# Patient Record
Sex: Male | Born: 1980 | Race: White | Hispanic: No | Marital: Married | Smoking: Never smoker
Health system: Southern US, Community
[De-identification: ages and names within clinical notes are randomized; demographics above are authoritative.]

---

## 2017-07-31 ENCOUNTER — Ambulatory Visit: Payer: Self-pay | Admitting: Family

## 2019-09-08 ENCOUNTER — Ambulatory Visit: Payer: BC Managed Care – PPO | Admitting: Family Medicine

## 2019-09-08 ENCOUNTER — Encounter: Payer: Self-pay | Admitting: Family Medicine

## 2019-09-08 ENCOUNTER — Other Ambulatory Visit: Payer: Self-pay

## 2019-09-08 VITALS — BP 124/79 | HR 57 | Temp 98.2°F | Wt 140.0 lb

## 2019-09-08 DIAGNOSIS — Z7689 Persons encountering health services in other specified circumstances: Secondary | ICD-10-CM

## 2019-09-08 DIAGNOSIS — Z13228 Encounter for screening for other metabolic disorders: Secondary | ICD-10-CM

## 2019-09-08 DIAGNOSIS — M7989 Other specified soft tissue disorders: Secondary | ICD-10-CM

## 2019-09-08 DIAGNOSIS — Z130 Encounter for screening for diseases of the blood and blood-forming organs and certain disorders involving the immune mechanism: Secondary | ICD-10-CM

## 2019-09-08 DIAGNOSIS — Z13220 Encounter for screening for lipoid disorders: Secondary | ICD-10-CM

## 2019-09-08 NOTE — Patient Instructions (Addendum)
I think this is a cyst (benign).  Differential diagnosis is lipoma (benign fatty mass).  We are getting an ultrasound to further evaluate it.  If it is inconclusive or abnormal, we will order MRI.  I will contact you with results once they are available.   Also, future labs are in.  You can come in fasting any time for this or wait until your DOT physical (can be done with [facility] or dr Darlyn Read here)  Epidermal Cyst  An epidermal cyst is a sac made of skin tissue. The sac contains a substance called keratin. Keratin is a protein that is normally secreted through the hair follicles. When keratin becomes trapped in the top layer of skin (epidermis), it can form an epidermal cyst. Epidermal cysts can be found anywhere on your body. These cysts are usually harmless (benign), and they may not cause symptoms unless they become infected. What are the causes? This condition may be caused by:  A blocked hair follicle.  A hair that curls and re-enters the skin instead of growing straight out of the skin (ingrown hair).  A blocked pore.  Irritated skin.  An injury to the skin.  Certain conditions that are passed along from parent to child (inherited).  Human papillomavirus (HPV).  Long-term (chronic) sun damage to the skin. What increases the risk? The following factors may make you more likely to develop an epidermal cyst:  Having acne.  Being overweight.  Being 45-25 years old. What are the signs or symptoms? The only symptom of this condition may be a small, painless lump underneath the skin. When an epidermal cyst ruptures, it may become infected. Symptoms may include:  Redness.  Inflammation.  Tenderness.  Warmth.  Fever.  Keratin draining from the cyst. Keratin is grayish-white, bad-smelling substance.  Pus draining from the cyst. How is this diagnosed? This condition is diagnosed with a physical exam.  In some cases, you may have a sample of tissue (biopsy)  taken from your cyst to be examined under a microscope or tested for bacteria.  You may be referred to a health care provider who specializes in skin care (dermatologist). How is this treated? In many cases, epidermal cysts go away on their own without treatment. If a cyst becomes infected, treatment may include:  Opening and draining the cyst, done by a health care provider. After draining, minor surgery to remove the rest of the cyst may be done.  Antibiotic medicine.  Injections of medicines (steroids) that help to reduce inflammation.  Surgery to remove the cyst. Surgery may be done if the cyst: ? Becomes large. ? Bothers you. ? Has a chance of turning into cancer.  Do not try to open a cyst yourself. Follow these instructions at home:  Take over-the-counter and prescription medicines only as told by your health care provider.  If you were prescribed an antibiotic medicine, take it it as told by your health care provider. Do not stop using the antibiotic even if you start to feel better.  Keep the area around your cyst clean and dry.  Wear loose, dry clothing.  Avoid touching your cyst.  Check your cyst every day for signs of infection. Check for: ? Redness, swelling, or pain. ? Fluid or blood. ? Warmth. ? Pus or a bad smell.  Keep all follow-up visits as told by your health care provider. This is important. How is this prevented?  Wear clean, dry, clothing.  Avoid wearing tight clothing.  Keep your skin  clean and dry. Take showers or baths every day. Contact a health care provider if:  Your cyst develops symptoms of infection.  Your condition is not improving or is getting worse.  You develop a cyst that looks different from other cysts you have had.  You have a fever. Get help right away if:  Redness spreads from the cyst into the surrounding area. Summary  An epidermal cyst is a sac made of skin tissue. These cysts are usually harmless (benign), and  they may not cause symptoms unless they become infected.  If a cyst becomes infected, treatment may include surgery to open and drain the cyst, or to remove it. Treatment may also include medicines by mouth or through an injection.  Take over-the-counter and prescription medicines only as told by your health care provider. If you were prescribed an antibiotic medicine, take it as told by your health care provider. Do not stop using the antibiotic even if you start to feel better.  Contact a health care provider if your condition is not improving or is getting worse.  Keep all follow-up visits as told by your health care provider. This is important. This information is not intended to replace advice given to you by your health care provider. Make sure you discuss any questions you have with your health care provider. Document Revised: 09/12/2018 Document Reviewed: 12/03/2017 Elsevier Patient Education  [employer].   Lipoma  A lipoma is a noncancerous (benign) tumor that is made up of fat cells. This is a very common type of soft-tissue growth. Lipomas are usually found under the skin (subcutaneous). They may occur in any tissue of the body that contains fat. Common areas for lipomas to appear include the back, arms, shoulders, buttocks, and thighs. Lipomas grow slowly, and they are usually painless. Most lipomas do not cause problems and do not require treatment. What are the causes? The cause of this condition is not known. What increases the risk? You are more likely to develop this condition if:  You are 42-69 years old.  You have a family history of lipomas. What are the signs or symptoms? A lipoma usually appears as a small, round bump under the skin. In most cases, the lump will:  Feel soft or rubbery.  Not cause pain or other symptoms. However, if a lipoma is located in an area where it pushes on nerves, it can become painful or cause other symptoms. How is this  diagnosed? A lipoma can usually be diagnosed with a physical exam. You may also have tests to confirm the diagnosis and to rule out other conditions. Tests may include:  Imaging tests, such as a CT scan or an MRI.  Removal of a tissue sample to be looked at under a microscope (biopsy). How is this treated? Treatment for this condition depends on the size of the lipoma and whether it is causing any symptoms.  For small lipomas that are not causing problems, no treatment is needed.  If a lipoma is bigger or it causes problems, surgery may be done to remove the lipoma. Lipomas can also be removed to improve appearance. Most often, the procedure is done after applying a medicine that numbs the area (local anesthetic).  Liposuction may be done to reduce the size of the lipoma before it is removed through surgery, or it may be done to remove the lipoma. Lipomas are removed with this method in order to limit incision size and scarring. A liposuction tube is inserted  through a small incision into the lipoma, and the contents of the lipoma are removed through the tube with suction. Follow these instructions at home:  Watch your lipoma for any changes.  Keep all follow-up visits as told by your health care provider. This is important. Contact a health care provider if:  Your lipoma becomes larger or hard.  Your lipoma becomes painful, red, or increasingly swollen. These could be signs of infection or a more serious condition. Get help right away if:  You develop tingling or numbness in an area near the lipoma. This could indicate that the lipoma is causing nerve damage. Summary  A lipoma is a noncancerous tumor that is made up of fat cells.  Most lipomas do not cause problems and do not require treatment.  If a lipoma is bigger or it causes problems, surgery may be done to remove the lipoma.  Contact a health care provider if your lipoma becomes larger or hard, or if it becomes painful, red,  or increasingly swollen. Pain, redness, and swelling could be signs of infection or a more serious condition. This information is not intended to replace advice given to you by your health care provider. Make sure you discuss any questions you have with your health care provider. Document Revised: 01/06/2019 Document Reviewed: 01/06/2019 Elsevier Patient Education  2020 [employer].

## 2019-09-08 NOTE — Progress Notes (Signed)
Subjective: ZB:[id number] care, mass of chest HPI: Joseph Dorsey is a 39 y.o. male presenting to clinic today for:  1.  Chest mass Patient noted a soft tissue mass noted along the left anterior chest a few months ago.  He denies any pain or growth.  No change in texture.  No preceding injury.  No treatments because it has not been bothering him.  He has a very similar smaller mass noted on the right back.  No night sweats, fevers, chills, unplanned weight loss.  No change in appetite.  He is here today with his wife for further evaluation.  History reviewed. No pertinent past medical history. History reviewed. No pertinent surgical history. Social History   Socioeconomic History  . Marital status: Married    Spouse name: Not on file  . Number of children: Not on file  . Years of education: Not on file  . Highest education level: Not on file  Occupational History  . Not on file  Tobacco Use  . Smoking status: Never Smoker  . Smokeless tobacco: Never Used  Substance and Sexual Activity  . Alcohol use: Never  . Drug use: Never  . Sexual activity: Yes    Birth control/protection: None  Other Topics Concern  . Not on file  Social History Narrative  . Not on file   Social Determinants of Health   Financial Resource Strain:   . Difficulty of Paying Living Expenses:   Food Insecurity:   . Worried About Charity fundraiser in the Last Year:   . Arboriculturist in the Last Year:   Transportation Needs:   . Film/video editor (Medical):   Marland Kitchen Lack of Transportation (Non-Medical):   Physical Activity:   . Days of Exercise per Week:   . Minutes of Exercise per Session:   Stress:   . Feeling of Stress :   Social Connections:   . Frequency of Communication with Friends and Family:   . Frequency of Social Gatherings with Friends and Family:   . Attends Religious Services:   . Active Member of Clubs or Organizations:   . Attends Archivist Meetings:   Marland Kitchen Marital  Status:   Intimate Partner Violence:   . Fear of Current or Ex-Partner:   . Emotionally Abused:   Marland Kitchen Physically Abused:   . Sexually Abused:    No outpatient medications have been marked as taking for the 09/08/19 encounter (Office Visit) with Joseph Norlander, DO.   Family History  Problem Relation Age of Onset  . Cancer Father   . Hypertension Father   . Hyperlipidemia Father    Not on File   Health Maintenance: Tdap  ROS: Per HPI  Objective: Office vital signs reviewed. BP 124/79   Pulse (!) 57   Temp 98.2 F (36.8 C)   Wt 140 lb (63.5 kg)   Physical Examination:  General: Awake, alert, well nourished, No acute distress HEENT: Normal, sclera white, MMM Cardio: regular rate and rhythm, S1S2 heard, no murmurs appreciated Pulm: clear to auscultation bilaterally, no wheezes, rhonchi or rales; normal work of breathing on room air Chest: Mobile, rubbery/cystic soft tissue mass that is gumball in size noted along the left anterior chest at approximately anterior lateral ribs 5 and 6.  Nontender.  No visible punctum. Psych: Mood stable, speech normal, affect appropriate, pleasant and interactive  Assessment/ Plan: 39 y.o. male   1. Mass of soft tissue Highly suspicious for cyst versus lipoma.  I favor the former.  Obtain ultrasound to further evaluate.  We discussed other differential diagnosis including liposarcoma.  Albeit very low risk. - [place] CHEST SOFT TISSUE; Future  2. Establishing care with new doctor, encounter for  3. Screening cholesterol level No previous labs for comparison.  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 Screening for metabolic disorder - [id number]+OFVW; Future  5. Screening, anemia, deficiency, iron - CBC; Future   Joseph Dorsey, [place] [phone]

## 2019-09-10 ENCOUNTER — Other Ambulatory Visit: Payer: BC Managed Care – PPO

## 2019-09-15 ENCOUNTER — Other Ambulatory Visit: Payer: Self-pay

## 2019-09-15 ENCOUNTER — Ambulatory Visit (INDEPENDENT_AMBULATORY_CARE_PROVIDER_SITE_OTHER): Payer: BC Managed Care – PPO

## 2019-09-15 DIAGNOSIS — R222 Localized swelling, mass and lump, trunk: Secondary | ICD-10-CM | POA: Diagnosis not present

## 2019-09-15 DIAGNOSIS — M7989 Other specified soft tissue disorders: Secondary | ICD-10-CM

## 2019-10-03 ENCOUNTER — Encounter: Payer: Self-pay | Admitting: Family Medicine

## 2019-10-06 ENCOUNTER — Other Ambulatory Visit: Payer: Self-pay

## 2019-10-06 ENCOUNTER — Encounter: Payer: Self-pay | Admitting: Nurse Practitioner

## 2019-10-06 ENCOUNTER — Ambulatory Visit (INDEPENDENT_AMBULATORY_CARE_PROVIDER_SITE_OTHER): Payer: BC Managed Care – PPO | Admitting: Nurse Practitioner

## 2019-10-06 VITALS — BP 112/71 | HR 49 | Resp 20 | Ht 65.0 in | Wt 138.0 lb

## 2019-10-06 DIAGNOSIS — Z024 Encounter for examination for driving license: Secondary | ICD-10-CM

## 2019-10-06 LAB — URINALYSIS
Bilirubin, UA: NEGATIVE
Glucose, UA: NEGATIVE
Ketones, UA: NEGATIVE
Leukocytes,UA: NEGATIVE
Nitrite, UA: NEGATIVE
Protein,UA: NEGATIVE
RBC, UA: NEGATIVE
Specific Gravity, UA: 1.01 (ref 1.005–1.030)
Urobilinogen, Ur: 0.2 mg/dL (ref 0.2–1.0)
pH, UA: 7 (ref 5.0–7.5)

## 2019-10-06 NOTE — Progress Notes (Signed)
DOT phyical- see scanned in exam

## 2020-07-19 ENCOUNTER — Other Ambulatory Visit: Payer: Self-pay

## 2020-07-19 ENCOUNTER — Ambulatory Visit: Payer: BC Managed Care – PPO | Admitting: Family Medicine

## 2020-07-19 ENCOUNTER — Encounter: Payer: Self-pay | Admitting: Family Medicine

## 2020-07-19 ENCOUNTER — Ambulatory Visit (INDEPENDENT_AMBULATORY_CARE_PROVIDER_SITE_OTHER): Payer: 59 | Admitting: Family Medicine

## 2020-07-19 VITALS — BP 109/72 | HR 54 | Temp 98.0°F | Ht 65.0 in | Wt 145.8 lb

## 2020-07-19 DIAGNOSIS — K458 Other specified abdominal hernia without obstruction or gangrene: Secondary | ICD-10-CM
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
## 2020-07-19 NOTE — Patient Instructions (Signed)
Hernia, Adult   A hernia is the bulging of an organ or tissue through a weak spot in the muscles of the abdomen (abdominal wall). Hernias develop most often near the belly button (navel) or the area where the leg meets the lower abdomen (groin). Common types of hernias include: Incisional hernia. This type bulges through a scar from an abdominal surgery. Umbilical hernia. This type develops near the navel. Inguinal hernia. This type develops in the groin or scrotum. Femoral hernia. This type develops under the groin, in the upper thigh area. Hiatal hernia. This type occurs when part of the stomach slides above the muscle that separates the abdomen from the chest (diaphragm). What are the causes? This condition may be caused by: Heavy lifting. Coughing over a long period of time. Straining to have a bowel movement. Constipation can lead to straining. An incision made during an abdominal surgery. A physical problem that is present at birth (congenital defect). Being overweight or obese. Smoking. Excess fluid in the abdomen. Undescended testicles in males. What are the signs or symptoms? The main symptom is a skin-colored, rounded bulge in the area of the hernia. However, a bulge may not always be present. It may grow bigger or be more visible when you cough or strain (such as when lifting something heavy). A hernia that can be pushed back into the area (is reducible) rarely causes pain. A hernia that cannot be pushed back into the area (is incarcerated) may lose its blood supply (become strangulated). A hernia that is incarcerated may cause: Pain. Fever. Nausea and vomiting. Swelling. Constipation. How is this diagnosed? A hernia may be diagnosed based on: Your symptoms and medical history. A physical exam. Your health care provider may ask you to cough or move in certain ways to see if the hernia becomes visible. Imaging tests, such as: X-rays. Ultrasound. CT scan. How is this  treated? A hernia that is small and painless may not need to be treated. A hernia that is large or painful may be treated with surgery. Inguinal hernias may be treated with surgery to prevent incarceration or strangulation. Strangulated hernias are always treated with surgery because a lack of blood supply to the trapped organ or tissue can cause it to die. Surgery to treat a hernia involves pushing the bulge back into place and repairing the weak area of the muscle or abdominal wall. Follow these instructions at home: Activity Avoid straining. Do not lift anything that is heavier than 10 lb (4.5 kg), or the limit that you are told, until your health care provider says that it is safe. When lifting heavy objects, lift with your leg muscles, not your back muscles. Preventing constipation Take actions to prevent constipation. Constipation leads to straining with bowel movements, which can make a hernia worse or cause a hernia repair to break down. Your health care provider may recommend that you: Drink enough fluid to keep your urine pale yellow. Eat foods that are high in fiber, such as fresh fruits and vegetables, whole grains, and beans. Limit foods that are high in fat and processed sugars, such as fried or sweet foods. Take an over-the-counter or prescription medicine for constipation. General instructions When coughing, try to cough gently. You may try to push the hernia back in place by very gently pressing on it while lying down. Do not try to force the bulge back in if it will not push in easily. If you are overweight, work with your health care provider to lose   weight safely. Do not use any products that contain nicotine or tobacco, such as cigarettes and e-cigarettes. If you need help quitting, ask your health care provider. If you are scheduled for hernia repair, watch your hernia for any changes in shape, size, or color. Tell your health care provider about any changes or new  symptoms. Take over-the-counter and prescription medicines only as told by your health care provider. Keep all follow-up visits as told by your health care provider. This is important. Contact a health care provider if: You develop new pain, swelling, or redness around your hernia. You have signs of constipation, such as: Fewer bowel movements in a week than normal. Difficulty having a bowel movement. Stools that are dry, hard, or larger than normal. Get help right away if: You have a fever. You have abdomen pain that gets worse. You feel nauseous or you vomit. You cannot push the hernia back in place by very gently pressing on it while lying down. Do not try to force the bulge back in if it will not push in easily. The hernia: Changes in shape, size, or color. Feels hard or tender. These symptoms may represent a serious problem that is an emergency. Do not wait to see if the symptoms will go away. Get medical help right away. Call your local emergency services (911 in the [place] Summary A hernia is the bulging of an organ or tissue through a weak spot in the muscles of the abdomen (abdominal wall). The main symptom is a skin-colored, rounded lump (bulge) in the hernia area. However, a bulge may not always be present. It may grow bigger or more visible when you cough or strain (such as when having a bowel movement). A hernia that is small and painless may not need to be treated. A hernia that is large or painful may be treated with surgery. Surgery to treat a hernia involves pushing the bulge back into place and repairing the weak part of the abdomen. This information is not intended to replace advice given to you by your health care provider. Make sure you discuss any questions you have with your health care provider. Document Revised: 09/12/2018 Document Reviewed: 02/21/2017 Elsevier Patient Education  2021 [employer]  

## 2021-03-21 ENCOUNTER — Ambulatory Visit (INDEPENDENT_AMBULATORY_CARE_PROVIDER_SITE_OTHER): Payer: 59 | Admitting: Family Medicine

## 2021-03-21 ENCOUNTER — Telehealth: Payer: Self-pay | Admitting: Family Medicine

## 2021-03-21 DIAGNOSIS — R1033 Periumbilical pain: Secondary | ICD-10-CM | POA: Diagnosis not present

## 2021-03-21 DIAGNOSIS — K429 Umbilical hernia without obstruction or gangrene: Secondary | ICD-10-CM

## 2021-03-21 NOTE — Telephone Encounter (Signed)
Patients wife aware that he will need to be seen  

## 2021-03-21 NOTE — Telephone Encounter (Signed)
Calling back to see if patient needs to come to his appt for a referral for MRI for hernia.

## 2021-03-21 NOTE — Progress Notes (Signed)
Telephone visit  Subjective: ZO:XWRUEA PCP: Raliegh Ip, DO VWU:[id number] Soderholm is a 40 y.o. male calls for telephone consult today. Patient provides verbal consent for consult held via phone.  Due to COVID-19 pandemic this visit was conducted virtually. This visit type was conducted due to national recommendations for restrictions regarding the COVID-19 Pandemic (e.g. social distancing, sheltering in place) in an effort to limit this patient's exposure and mitigate transmission in our community. All issues noted in this document were discussed and addressed.  A physical exam was not performed with this format.   Location of patient: home Location of provider: [facility] Others present for call: wife  1. Hernia Patient reports ongoing umbilical hernia.  He feels like the abdominal wall defect seems to be getting bigger.  He denies any change in stool including caliber.  Denies any nausea, vomiting, severe abdominal pain.  The hernia is reducible but he has to reduce it several times per day because it constantly pokes out.  No hematochezia or melena.  No fevers.  He wishes to proceed with surgical intervention and asked for referral and imaging today   ROS: Per HPI  Not on File No past medical history on file. No current outpatient medications on file.  Assessment/ Plan: 40 y.o. male   Umbilical hernia without obstruction and without gangrene - Plan: CT Abdomen Pelvis Wo Contrast, Ambulatory referral to General Surgery  Periumbilical abdominal pain - Plan: CT Abdomen Pelvis Wo Contrast, Ambulatory referral to General Surgery  Umbilical hernia diagnosed in February 2022.  No red flag signs or symptoms but the lesion is getting larger and he is wanting to proceed with surgical evaluation.  CT abdomen pelvis ordered to further delineate lesion of concern.  We reviewed red flag signs and symptoms warranting further evaluation emergency department.  He voiced good understanding.  Start time:  12:31pm End time: 12:37pm  Total time spent on patient care (including telephone call/ virtual visit): 6 minutes  Londen Bok Hulen Skains, DO [facility] [phone]

## 2021-03-23 ENCOUNTER — Encounter: Payer: Self-pay | Admitting: Family Medicine

## 2021-04-01 ENCOUNTER — Ambulatory Visit (HOSPITAL_BASED_OUTPATIENT_CLINIC_OR_DEPARTMENT_OTHER)
Admission: RE | Admit: 2021-04-01 | Discharge: 2021-04-01 | Disposition: A | Discharge status: Home / Self Care | Payer: 59 | Source: Ambulatory Visit | Attending: Family Medicine | Admitting: Family Medicine

## 2021-04-01 DIAGNOSIS — K429 Umbilical hernia without obstruction or gangrene: Secondary | ICD-10-CM | POA: Insufficient documentation

## 2021-04-01 DIAGNOSIS — R1033 Periumbilical pain: Secondary | ICD-10-CM | POA: Diagnosis present

## 2021-04-12 ENCOUNTER — Ambulatory Visit: Payer: 59 | Admitting: General Surgery

## 2021-05-03 ENCOUNTER — Encounter: Payer: Self-pay | Admitting: General Surgery

## 2021-05-03 ENCOUNTER — Ambulatory Visit (INDEPENDENT_AMBULATORY_CARE_PROVIDER_SITE_OTHER): Payer: 59 | Admitting: General Surgery

## 2021-05-03 ENCOUNTER — Other Ambulatory Visit: Payer: Self-pay

## 2021-05-03 VITALS — BP 123/82 | HR 61 | Temp 98.3°F | Resp 12 | Ht 65.0 in | Wt 145.0 lb

## 2021-05-03 DIAGNOSIS — K431 Incisional hernia with gangrene: Secondary | ICD-10-CM | POA: Diagnosis not present
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
## 2021-05-24 NOTE — Patient Instructions (Signed)
Joseph Dorsey  05/24/2021     @PREFPERIOPPHARMACY @   Your procedure is scheduled on  05/27/2021.   Report to 05/29/2021 at  [phone]  A.M.   Call this number if you have problems the morning of surgery:  [phone]   Remember:  Do not eat or drink after midnight.      Take these medicines the morning of surgery with A SIP OF WATER                                                    None     Do not wear jewelry, make-up or nail polish.  Do not wear lotions, powders, or perfumes, or deodorant.  Do not shave 48 hours prior to surgery.  Men may shave face and neck.  Do not bring valuables to the hospital.  [facility] is not responsible for any belongings or valuables.  Contacts, dentures or bridgework may not be worn into surgery.  Leave your suitcase in the car.  After surgery it may be brought to your room.  For patients admitted to the hospital, discharge time will be determined by your treatment team.  Patients discharged the day of surgery will not be allowed to drive home and must have someone with them for 24 hours.    Special instructions:   DO NOT smoke tobacco or vape for 24 hours before your procedure.  Please read over the following fact sheets that you were given. Coughing and Deep Breathing, Surgical Site Infection Prevention, Anesthesia Post-op Instructions, and Care and Recovery After Surgery      Open Hernia Repair, Adult, Care After What can I expect after the procedure? After the procedure, it is common to have: Mild discomfort. Slight bruising. Mild swelling. Pain in the belly (abdomen). A small amount of blood from the cut from surgery (incision). Follow these instructions at home: Your doctor may give you more specific instructions. If you have problems, call your doctor. Medicines Take over-the-counter and prescription medicines only as told by your doctor. If told, take steps to prevent problems with pooping (constipation). You may  need to: Drink enough fluid to keep your pee (urine) pale yellow. Take medicines. You will be told what medicines to take. Eat foods that are high in fiber. These include beans, whole grains, and fresh fruits and vegetables. Limit foods that are high in fat and sugar. These include fried or sweet foods. Ask your doctor if you should avoid driving or using machines while you are taking your medicine. Incision care  Follow instructions from your doctor about how to take care of your incision. Make sure you: Wash your hands with soap and water for at least 20 seconds before and after you change your bandage (dressing). If you cannot use soap and water, use hand sanitizer. Change your bandage. Leave stitches or skin glue in place for at least 2 weeks. Leave tape strips alone unless you are told to take them off. You may trim the edges of the tape strips if they curl up. Check your incision every day for signs of infection. Check for: More redness, swelling, or pain. More fluid or blood. Warmth. Pus or a bad smell. Wear loose, soft clothing while your incision heals. Activity  Rest as told by your doctor. Do not  lift anything that is heavier than 10 lb (4.5 kg), or the limit that you are told. Do not play contact sports until your doctor says that this is safe. If you were given a sedative during your procedure, do not drive or use machines until your doctor says that it is safe. A sedative is a medicine that helps you relax. Return to your normal activities when your doctor says that it is safe. General instructions Do not take baths, swim, or use a hot tub. Ask your doctor about taking showers or sponge baths. Hold a pillow over your belly when you cough or sneeze. This helps with pain. Do not smoke or use any products that contain nicotine or tobacco. If you need help quitting, ask your doctor. Keep all follow-up visits. Contact a doctor if: You have any of these signs of infection in  or around your incision: More redness, swelling, or pain. More fluid or blood. Warmth. Pus. A bad smell. You have a fever or chills. You have blood in your poop (stool). You have not pooped (had a bowel movement) in 2-3 days. Medicine does not help your pain. Get help right away if: You have chest pain, or you are short of breath. You feel faint or light-headed. You have very bad pain. You vomit and your pain is worse. You have pain, swelling, or redness in a leg. These symptoms may be an emergency. Get help right away. Call your local emergency services (911 in the [place] Do not wait to see if the symptoms will go away. Do not drive yourself to the hospital. Summary After this procedure, it is common to have mild discomfort, slight bruising, and mild swelling. Follow instructions from your doctor about how to take care of your cut from surgery (incision). Check every day for signs of infection. Do not lift heavy objects or play contact sports until your doctor says it is safe. Return to your normal activities as told by your doctor. This information is not intended to replace advice given to you by your health care provider. Make sure you discuss any questions you have with your health care provider. Document Revised: 01/05/2020 Document Reviewed: 01/05/2020 Elsevier Patient Education  2022 [employer] General Anesthesia, Adult, Care After This sheet gives you information about how to care for yourself after your procedure. Your health care provider may also give you more specific instructions. If you have problems or questions, contact your health care provider. What can I expect after the procedure? After the procedure, the following side effects are common: Pain or discomfort at the IV site. Nausea. Vomiting. Sore throat. Trouble concentrating. Feeling cold or chills. Feeling weak or tired. Sleepiness and fatigue. Soreness and body aches. These side effects can affect  parts of the body that were not involved in surgery. Follow these instructions at home: For the time period you were told by your health care provider:  Rest. Do not participate in activities where you could fall or become injured. Do not drive or use machinery. Do not drink alcohol. Do not take sleeping pills or medicines that cause drowsiness. Do not make important decisions or sign legal documents. Do not take care of children on your own. Eating and drinking Follow any instructions from your health care provider about eating or drinking restrictions. When you feel hungry, start by eating small amounts of foods that are soft and easy to digest (bland), such as toast. Gradually return to your regular diet. Drink enough fluid to  keep your urine pale yellow. If you vomit, rehydrate by drinking water, juice, or clear broth. General instructions If you have sleep apnea, surgery and certain medicines can increase your risk for breathing problems. Follow instructions from your health care provider about wearing your sleep device: Anytime you are sleeping, including during daytime naps. While taking prescription pain medicines, sleeping medicines, or medicines that make you drowsy. Have a responsible adult stay with you for the time you are told. It is important to have someone help care for you until you are awake and alert. Return to your normal activities as told by your health care provider. Ask your health care provider what activities are safe for you. Take over-the-counter and prescription medicines only as told by your health care provider. If you smoke, do not smoke without supervision. Keep all follow-up visits as told by your health care provider. This is important. Contact a health care provider if: You have nausea or vomiting that does not get better with medicine. You cannot eat or drink without vomiting. You have pain that does not get better with medicine. You are unable to  pass urine. You develop a skin rash. You have a fever. You have redness around your IV site that gets worse. Get help right away if: You have difficulty breathing. You have chest pain. You have blood in your urine or stool, or you vomit blood. Summary After the procedure, it is common to have a sore throat or nausea. It is also common to feel tired. Have a responsible adult stay with you for the time you are told. It is important to have someone help care for you until you are awake and alert. When you feel hungry, start by eating small amounts of foods that are soft and easy to digest (bland), such as toast. Gradually return to your regular diet. Drink enough fluid to keep your urine pale yellow. Return to your normal activities as told by your health care provider. Ask your health care provider what activities are safe for you. This information is not intended to replace advice given to you by your health care provider. Make sure you discuss any questions you have with your health care provider. Document Revised: 02/05/2020 Document Reviewed: 09/04/2019 Elsevier Patient Education  2022 [employer].

## 2021-05-25 ENCOUNTER — Encounter (HOSPITAL_COMMUNITY): Payer: Self-pay

## 2021-05-25 ENCOUNTER — Encounter (HOSPITAL_COMMUNITY)
Admission: RE | Admit: 2021-05-25 | Discharge: 2021-05-25 | Disposition: A | Discharge status: Home / Self Care | Payer: 59 | Source: Ambulatory Visit | Attending: General Surgery | Admitting: General Surgery

## 2021-05-25 ENCOUNTER — Other Ambulatory Visit: Payer: Self-pay

## 2021-05-27 ENCOUNTER — Ambulatory Visit (HOSPITAL_COMMUNITY): Payer: 59 | Admitting: Anesthesiology

## 2021-05-27 ENCOUNTER — Encounter (HOSPITAL_COMMUNITY): Payer: Self-pay | Admitting: General Surgery

## 2021-05-27 ENCOUNTER — Ambulatory Visit (HOSPITAL_COMMUNITY)
Admission: RE | Admit: 2021-05-27 | Discharge: 2021-05-27 | Disposition: A | Discharge status: Home / Self Care | Payer: 59 | Source: Ambulatory Visit | Attending: General Surgery | Admitting: General Surgery

## 2021-05-27 ENCOUNTER — Encounter (HOSPITAL_COMMUNITY)
Admission: RE | Disposition: A | Discharge status: Home / Self Care | Payer: Self-pay | Source: Ambulatory Visit | Attending: General Surgery

## 2021-05-27 DIAGNOSIS — K432 Incisional hernia without obstruction or gangrene: Secondary | ICD-10-CM | POA: Diagnosis present

## 2021-05-27 HISTORY — PX: INCISIONAL HERNIA REPAIR: SHX193

## 2021-05-27 SURGERY — REPAIR, HERNIA, INCISIONAL
Anesthesia: General | Site: Abdomen

## 2021-05-27 MED ORDER — GLYCOPYRROLATE PF 0.2 MG/ML IJ SOSY
PREFILLED_SYRINGE | INTRAMUSCULAR | Status: AC
  Filled 2021-05-27: qty 1

## 2021-05-27 MED ORDER — BUPIVACAINE LIPOSOME 1.3 % IJ SUSP
INTRAMUSCULAR | Status: DC | PRN
  Administered 2021-05-27: 18 mL

## 2021-05-27 MED ORDER — MIDAZOLAM HCL 5 MG/5ML IJ SOLN
INTRAMUSCULAR | Status: DC | PRN
  Administered 2021-05-27: 2 mg via INTRAVENOUS

## 2021-05-27 MED ORDER — DEXAMETHASONE SODIUM PHOSPHATE 10 MG/ML IJ SOLN
INTRAMUSCULAR | Status: AC
  Filled 2021-05-27: qty 1

## 2021-05-27 MED ORDER — CHLORHEXIDINE GLUCONATE CLOTH 2 % EX PADS: 6.0000 | MEDICATED_PAD | Freq: Once | CUTANEOUS | Status: DC

## 2021-05-27 MED ORDER — PROPOFOL 10 MG/ML IV BOLUS
INTRAVENOUS | Status: AC
  Filled 2021-05-27: qty 20

## 2021-05-27 MED ORDER — ONDANSETRON HCL 4 MG/2ML IJ SOLN: 4.0000 mg | Freq: Once | INTRAMUSCULAR | Status: DC | PRN

## 2021-05-27 MED ORDER — GLYCOPYRROLATE PF 0.2 MG/ML IJ SOSY
PREFILLED_SYRINGE | INTRAMUSCULAR | Status: DC | PRN
Start: 2021-05-27 — End: 2021-05-27
  Administered 2021-05-27: .1 mg via INTRAVENOUS

## 2021-05-27 MED ORDER — FENTANYL CITRATE (PF) 250 MCG/5ML IJ SOLN
INTRAMUSCULAR | Status: AC
  Filled 2021-05-27: qty 5

## 2021-05-27 MED ORDER — LIDOCAINE HCL (PF) 2 % IJ SOLN
INTRAMUSCULAR | Status: AC
  Filled 2021-05-27: qty 5

## 2021-05-27 MED ORDER — ORAL CARE MOUTH RINSE: 15.0000 mL | Freq: Once | OROMUCOSAL | Status: AC

## 2021-05-27 MED ORDER — CEFAZOLIN SODIUM-DEXTROSE 2-4 GM/100ML-% IV SOLN
2.0000 g | INTRAVENOUS | Status: AC
  Administered 2021-05-27: 08:00:00 2 g via INTRAVENOUS

## 2021-05-27 MED ORDER — LACTATED RINGERS IV SOLN: INTRAVENOUS | Status: DC

## 2021-05-27 MED ORDER — LIDOCAINE 2% (20 MG/ML) 5 ML SYRINGE
INTRAMUSCULAR | Status: DC | PRN
  Administered 2021-05-27: 100 mg via INTRAVENOUS

## 2021-05-27 MED ORDER — FENTANYL CITRATE (PF) 100 MCG/2ML IJ SOLN
INTRAMUSCULAR | Status: DC | PRN
  Administered 2021-05-27 (×3): 50 ug via INTRAVENOUS

## 2021-05-27 MED ORDER — KETOROLAC TROMETHAMINE 30 MG/ML IJ SOLN
INTRAMUSCULAR | Status: AC
  Filled 2021-05-27: qty 1

## 2021-05-27 MED ORDER — DEXAMETHASONE SODIUM PHOSPHATE 4 MG/ML IJ SOLN
INTRAMUSCULAR | Status: DC | PRN
  Administered 2021-05-27: 5 mg via INTRAVENOUS

## 2021-05-27 MED ORDER — HYDROMORPHONE HCL 1 MG/ML IJ SOLN: 0.2500 mg | INTRAMUSCULAR | Status: DC | PRN

## 2021-05-27 MED ORDER — BUPIVACAINE LIPOSOME 1.3 % IJ SUSP
INTRAMUSCULAR | Status: AC
  Filled 2021-05-27: qty 20

## 2021-05-27 MED ORDER — ONDANSETRON HCL 4 MG/2ML IJ SOLN
INTRAMUSCULAR | Status: DC | PRN
  Administered 2021-05-27: 4 mg via INTRAVENOUS

## 2021-05-27 MED ORDER — HYDROCODONE-ACETAMINOPHEN 5-325 MG PO TABS: 1.0000 | ORAL_TABLET | ORAL | 0 refills | Status: AC | PRN

## 2021-05-27 MED ORDER — CEFAZOLIN SODIUM-DEXTROSE 2-4 GM/100ML-% IV SOLN
INTRAVENOUS | Status: AC
  Filled 2021-05-27: qty 100

## 2021-05-27 MED ORDER — 0.9 % SODIUM CHLORIDE (POUR BTL) OPTIME
TOPICAL | Status: DC | PRN
  Administered 2021-05-27: 08:00:00 1000 mL

## 2021-05-27 MED ORDER — KETOROLAC TROMETHAMINE 30 MG/ML IJ SOLN
30.0000 mg | Freq: Once | INTRAMUSCULAR | Status: AC
  Administered 2021-05-27: 09:00:00 30 mg via INTRAVENOUS
  Filled 2021-05-27: qty 1

## 2021-05-27 MED ORDER — PROPOFOL 10 MG/ML IV BOLUS
INTRAVENOUS | Status: DC | PRN
  Administered 2021-05-27: 200 mg via INTRAVENOUS

## 2021-05-27 MED ORDER — ONDANSETRON HCL 4 MG/2ML IJ SOLN
INTRAMUSCULAR | Status: AC
  Filled 2021-05-27: qty 2

## 2021-05-27 MED ORDER — CHLORHEXIDINE GLUCONATE 0.12 % MT SOLN
15.0000 mL | Freq: Once | OROMUCOSAL | Status: AC
  Administered 2021-05-27: 07:00:00 15 mL via OROMUCOSAL

## 2021-05-27 MED ORDER — MEPERIDINE HCL 50 MG/ML IJ SOLN: 6.2500 mg | INTRAMUSCULAR | Status: DC | PRN

## 2021-05-27 MED ORDER — MIDAZOLAM HCL 2 MG/2ML IJ SOLN
INTRAMUSCULAR | Status: AC
  Filled 2021-05-27: qty 2

## 2021-05-27 SURGICAL SUPPLY — 37 items
BLADE SURG SZ11 CARB STEEL (BLADE) ×3 IMPLANT
CHLORAPREP W/TINT 26 (MISCELLANEOUS) ×3 IMPLANT
CLOTH BEACON ORANGE TIMEOUT ST (SAFETY) ×3 IMPLANT
COVER LIGHT HANDLE STERIS (MISCELLANEOUS) ×6 IMPLANT
DERMABOND ADVANCED (GAUZE/BANDAGES/DRESSINGS) ×2
DERMABOND ADVANCED .7 DNX12 (GAUZE/BANDAGES/DRESSINGS) ×1 IMPLANT
ELECT REM PT RETURN 9FT ADLT (ELECTROSURGICAL) ×3
ELECTRODE REM PT RTRN 9FT ADLT (ELECTROSURGICAL) ×1 IMPLANT
GAUZE 4X4 16PLY ~~LOC~~+RFID DBL (SPONGE) ×3 IMPLANT
GLOVE SURG POLYISO LF SZ7.5 (GLOVE) ×3 IMPLANT
GLOVE SURG UNDER POLY LF SZ7 (GLOVE) ×6 IMPLANT
GOWN STRL REUS W/TWL LRG LVL3 (GOWN DISPOSABLE) ×9 IMPLANT
INST SET MINOR GENERAL (KITS) ×2 IMPLANT
KIT TURNOVER KIT A (KITS) ×3 IMPLANT
LIGASURE IMPACT 36 18CM CVD LR (INSTRUMENTS) ×2 IMPLANT
MANIFOLD NEPTUNE II (INSTRUMENTS) ×3 IMPLANT
MESH VENTRALEX ST 1-7/10 CRC S (Mesh General) ×2 IMPLANT
NDL HYPO 21X1.5 SAFETY (NEEDLE) ×1 IMPLANT
NEEDLE HYPO 21X1.5 SAFETY (NEEDLE) ×3 IMPLANT
NS IRRIG 1000ML POUR BTL (IV SOLUTION) ×3 IMPLANT
PACK MAJOR ABDOMINAL (CUSTOM PROCEDURE TRAY) IMPLANT
PACK MINOR (CUSTOM PROCEDURE TRAY) ×2 IMPLANT
PAD ARMBOARD 7.5X6 YLW CONV (MISCELLANEOUS) ×3 IMPLANT
PENCIL SMOKE EVACUATOR (MISCELLANEOUS) ×3 IMPLANT
SET BASIN LINEN APH (SET/KITS/TRAYS/PACK) ×3 IMPLANT
SUT ETHIBOND 0 MO6 C/R (SUTURE) ×2 IMPLANT
SUT MNCRL AB 4-0 PS2 18 (SUTURE) ×3 IMPLANT
SUT NOVA NAB GS-22 2 2-0 T-19 (SUTURE) IMPLANT
SUT PROLENE 0 CT 1 CR/8 (SUTURE) IMPLANT
SUT SILK 2 0 (SUTURE)
SUT SILK 2-0 18XBRD TIE 12 (SUTURE) IMPLANT
SUT VIC AB 2-0 CT1 27 (SUTURE) ×2
SUT VIC AB 2-0 CT1 TAPERPNT 27 (SUTURE) ×1 IMPLANT
SUT VIC AB 3-0 SH 27 (SUTURE) ×2
SUT VIC AB 3-0 SH 27X BRD (SUTURE) ×1 IMPLANT
SUT VICRYL AB 2 0 TIES (SUTURE) IMPLANT
SYR 20ML LL LF (SYRINGE) ×6 IMPLANT

## 2021-05-27 NOTE — Transfer of Care (Signed)
Immediate Anesthesia Transfer of Care Note  Patient: Joseph Dorsey  Procedure(s) Performed: HERNIA REPAIR INCISIONAL W/MESH (Abdomen)  Patient Location: PACU  Anesthesia Type:General  Level of Consciousness: drowsy  Airway & Oxygen Therapy: Patient Spontanous Breathing and Patient connected to face mask oxygen  Post-op Assessment: Report given to RN and Post -op Vital signs reviewed and stable  Post vital signs: Reviewed and stable  Last Vitals:  Vitals Value Taken Time  BP 96/64 05/27/21 0821  Temp    Pulse 54 05/27/21 0822  Resp 8 05/27/21 0822  SpO2 98 % 05/27/21 0822  Vitals shown include unvalidated device data.  Last Pain:  Vitals:   05/27/21 0646  TempSrc: Oral  PainSc: 0-No pain      Patients Stated Pain Goal: 5 (05/27/21 0646)  Complications: No notable events documented.

## 2021-05-27 NOTE — Anesthesia Procedure Notes (Signed)
Procedure Name: LMA Insertion Date/Time: 05/27/2021 7:34 AM Performed by: Julian Reil, CRNA Pre-anesthesia Checklist: Patient identified, Emergency Drugs available, Suction available and Patient being monitored Patient Re-evaluated:Patient Re-evaluated prior to induction Oxygen Delivery Method: Circle system utilized Preoxygenation: Pre-oxygenation with 100% oxygen Induction Type: IV induction LMA: LMA inserted LMA Size: 4.0 Tube type: Oral Number of attempts: 1 Placement Confirmation: positive ETCO2 Tube secured with: Tape Dental Injury: Teeth and Oropharynx as per pre-operative assessment

## 2021-05-27 NOTE — Op Note (Signed)
Patient:  MAISEN KLINGLER  DOB:  06-22-1980  MRN:  [id number]   Preop Diagnosis: Incisional hernia  Postop Diagnosis: Same  Procedure: Incisional herniorrhaphy with mesh  Surgeon: Franky Macho, MD  Anes: General  Indications: Patient is a 40 year old white male who presents with an incisional hernia.  The risks and benefits of the procedure including bleeding, infection, mesh use, the possibility of recurrence of the hernia were fully explained to the patient, who gave informed consent.  Procedure note: The patient was placed in the supine position.  After general anesthesia was administered, the abdomen was prepped and draped using the usual sterile technique with ChloraPrep.  Surgical site confirmation was performed.  An infraumbilical incision was made down to the fascia.  The umbilicus was freed away from the underlying fascia.  The patient was noted to have a 1.5 cm hernia defect from the previous midline incision.  The hernia sac was excised.  There was omentum and adipose tissue emanating from the hernia defect.  The excess tissue was excised using the LigaSure.  It was disposed of.  Care was taken to avoid the bowel.  A 4.3 cm Bard Ventralax ST patch was then inserted and secured to the fascia using 0 Ethibond interrupted sutures.  The overlying fascia was reapproximated over the mesh transversely using 0 Ethibond interrupted sutures.  The umbilicus was secured back to the fascia using a 2-0 Vicryl interrupted suture.  The subcutaneous layer was reapproximated using a 3-0 Vicryl interrupted suture.  Exparel was instilled into the surrounding wound.  The skin was closed using a 4-0 Monocryl subcuticular suture.  Dermabond was applied.  All tape and needle counts were correct at the end of the procedure.  The patient was awakened and transferred to PACU in stable condition.  Complications: None  EBL: Minimal  Specimen: None

## 2021-05-27 NOTE — Anesthesia Preprocedure Evaluation (Signed)
Anesthesia Evaluation  Patient identified by MRN, date of birth, ID band Patient awake    Reviewed: Allergy & Precautions, NPO status , Patient's Chart, lab work & pertinent test results  Airway Mallampati: I  TM Distance: >3 FB Neck ROM: Full    Dental  (+) Dental Advisory Given, Teeth Intact   Pulmonary neg pulmonary ROS,    Pulmonary exam normal breath sounds clear to auscultation       Cardiovascular negative cardio ROS Normal cardiovascular exam Rhythm:Regular Rate:Normal     Neuro/Psych negative neurological ROS  negative psych ROS   GI/Hepatic negative GI ROS, Neg liver ROS,   Endo/Other  negative endocrine ROS  Renal/GU negative Renal ROS  negative genitourinary   Musculoskeletal negative musculoskeletal ROS (+)   Abdominal   Peds negative pediatric ROS (+)  Hematology negative hematology ROS (+)   Anesthesia Other Findings   Reproductive/Obstetrics negative OB ROS                            Anesthesia Physical Anesthesia Plan  ASA: 1  Anesthesia Plan: General   Post-op Pain Management: Dilaudid IV   Induction: Intravenous  PONV Risk Score and Plan: 3 and Ondansetron, Dexamethasone and Midazolam  Airway Management Planned: Oral ETT and LMA  Additional Equipment:   Intra-op Plan:   Post-operative Plan: Extubation in OR  Informed Consent: I have reviewed the patients History and Physical, chart, labs and discussed the procedure including the risks, benefits and alternatives for the proposed anesthesia with the patient or authorized representative who has indicated his/her understanding and acceptance.     Dental advisory given  Plan Discussed with: CRNA and Surgeon  Anesthesia Plan Comments:         Anesthesia Quick Evaluation

## 2021-05-27 NOTE — Anesthesia Postprocedure Evaluation (Signed)
Anesthesia Post Note  Patient: DOCTOR SHEAHAN  Procedure(s) Performed: HERNIA REPAIR INCISIONAL W/MESH (Abdomen)  Patient location during evaluation: Phase II Anesthesia Type: General Level of consciousness: awake and alert and oriented Pain management: pain level controlled Vital Signs Assessment: post-procedure vital signs reviewed and stable Respiratory status: spontaneous breathing, nonlabored ventilation and respiratory function stable Cardiovascular status: blood pressure returned to baseline and stable Postop Assessment: no apparent nausea or vomiting Anesthetic complications: no   No notable events documented.   Last Vitals:  Vitals:   05/27/21 0845 05/27/21 0913  BP: 106/73 114/74  Pulse: (!) 57 68  Resp: 10 14  Temp:    SpO2: 96% 100%    Last Pain:  Vitals:   05/27/21 0913  TempSrc:   PainSc: 3                  Hiyab Nhem C Loanne Emery

## 2021-05-27 NOTE — Interval H&P Note (Signed)
History and Physical Interval Note:  05/27/2021 7:19 AM  Joseph Dorsey  has presented today for surgery, with the diagnosis of Incisional hernia.  The various methods of treatment have been discussed with the patient and family. After consideration of risks, benefits and other options for treatment, the patient has consented to  Procedure(s): HERNIA REPAIR INCISIONAL W/MESH (N/A) as a surgical intervention.  The patient's history has been reviewed, patient examined, no change in status, stable for surgery.  I have reviewed the patient's chart and labs.  Questions were answered to the patient's satisfaction.     Franky Macho

## 2021-05-31 ENCOUNTER — Encounter (HOSPITAL_COMMUNITY): Payer: Self-pay | Admitting: General Surgery

## 2021-06-04 ENCOUNTER — Telehealth (INDEPENDENT_AMBULATORY_CARE_PROVIDER_SITE_OTHER): Payer: 59 | Admitting: General Surgery

## 2021-06-04 DIAGNOSIS — Z09 Encounter for follow-up examination after completed treatment for conditions other than malignant neoplasm: Secondary | ICD-10-CM

## 2021-06-04 NOTE — Telephone Encounter (Signed)
Virtual telephone postop visit performed.  Patient has minimal pain at incision.  Is doing well and has no complaints.  I told him to call me should any problems arise.  As this was a part of the global surgical fee, this was not a billable visit.  Total telephone time was 3 1/2 minutes.

## 2021-11-07 ENCOUNTER — Encounter: Payer: Self-pay | Admitting: Family Medicine

## 2021-11-07 ENCOUNTER — Ambulatory Visit (INDEPENDENT_AMBULATORY_CARE_PROVIDER_SITE_OTHER): Payer: Self-pay | Admitting: Family Medicine

## 2021-11-07 VITALS — BP 104/62 | HR 50 | Temp 97.8°F | Ht 65.0 in | Wt 141.0 lb

## 2021-11-07 DIAGNOSIS — Z024 Encounter for examination for driving license: Secondary | ICD-10-CM

## 2021-11-07 DIAGNOSIS — Z0289 Encounter for other administrative examinations: Secondary | ICD-10-CM

## 2021-11-07 LAB — URINALYSIS
Bilirubin, UA: NEGATIVE
Glucose, UA: NEGATIVE
Ketones, UA: NEGATIVE
Leukocytes,UA: NEGATIVE
Nitrite, UA: NEGATIVE
Protein,UA: NEGATIVE
Specific Gravity, UA: 1.02 (ref 1.005–1.030)
Urobilinogen, Ur: 0.2 mg/dL (ref 0.2–1.0)
pH, UA: 6.5 (ref 5.0–7.5)

## 2021-11-07 NOTE — Progress Notes (Signed)
DOT exam performed. See attached form.  Martisha Toulouse, MD  

## 2021-12-04 IMAGING — US US SOFT TISSUE
1 series · 11 of 11 positions shown · non-contrast
Comparison: None.

CLINICAL DATA: Mobile nontender mass

EXAM:
ULTRASOUND OF SOFT TISSUES
TECHNIQUE: Ultrasound examination of the left anterolateral chest performed.

[Series 1: us soft tissue · 0.04mm/px · 11 acquisitions, 11 frames shown]
[im 1/11]
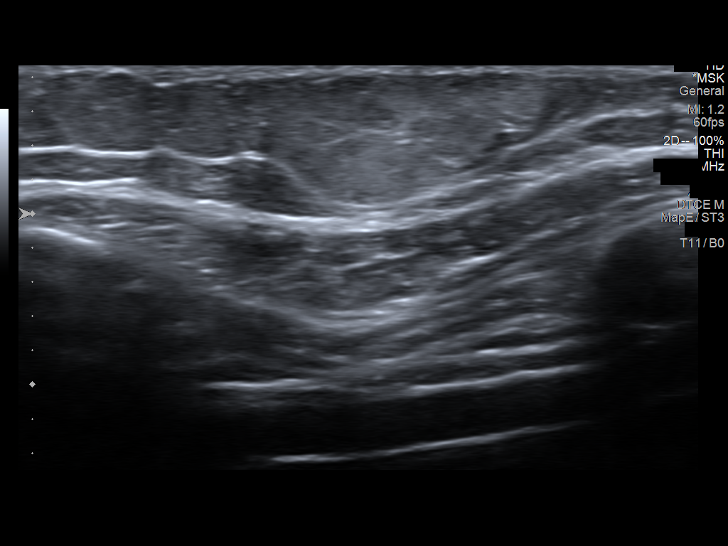
[im 2/11]
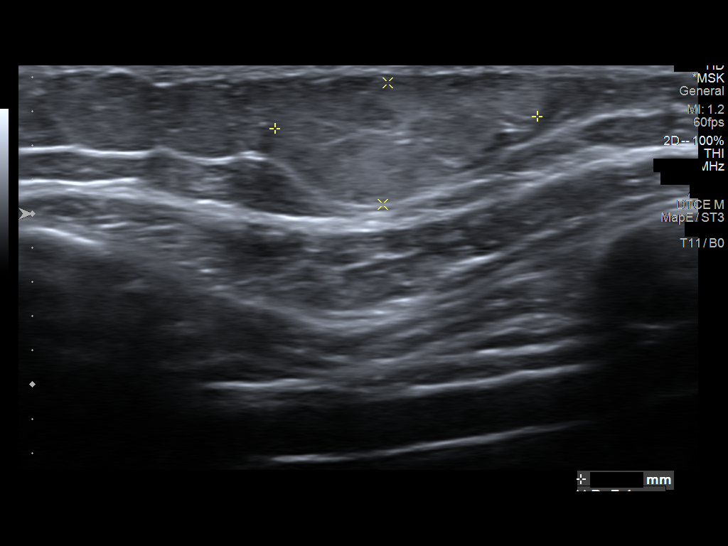
[im 3/11]
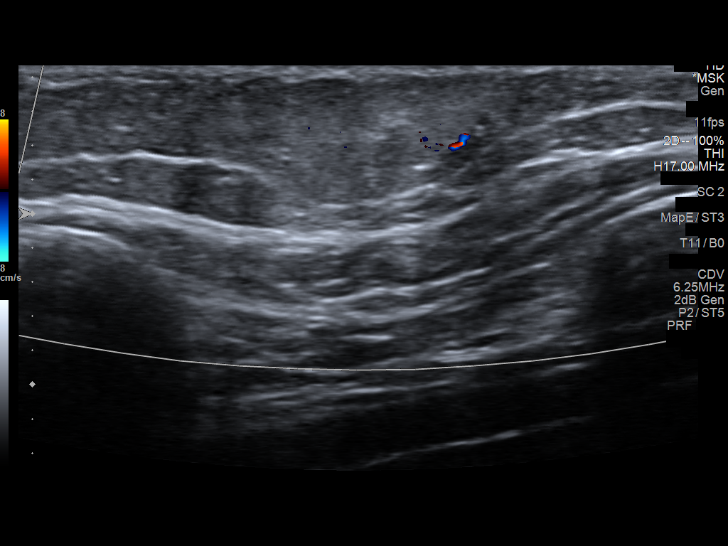
[im 4/11]
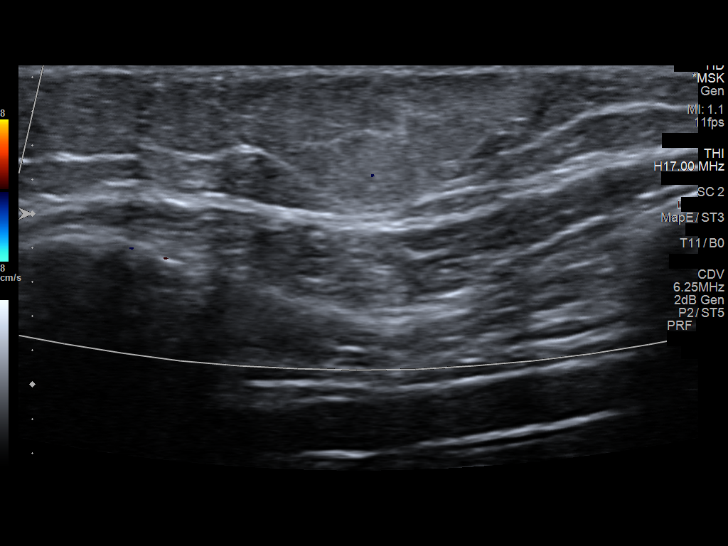
[im 5/11]
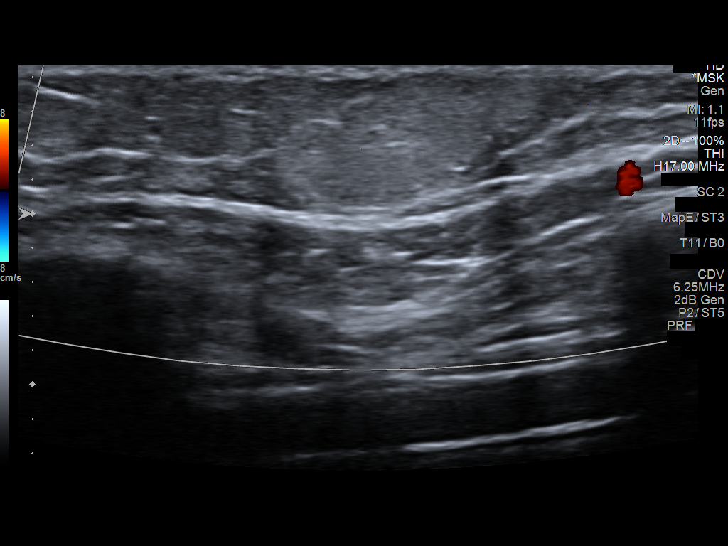
[im 6/11]
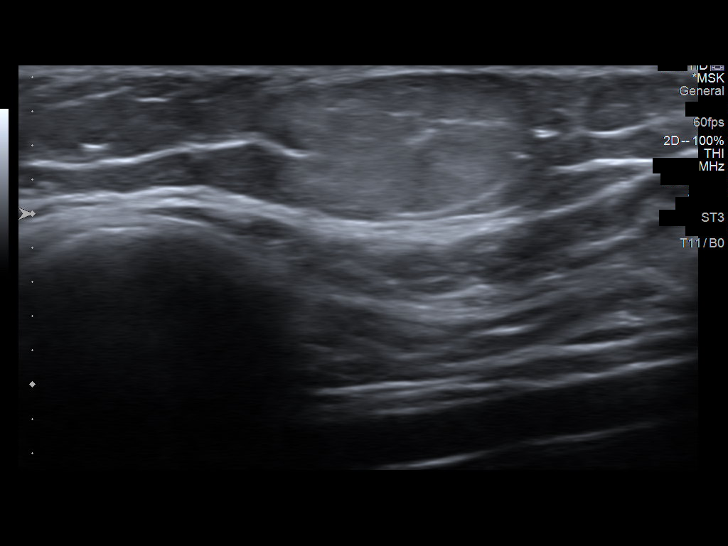
[im 7/11]
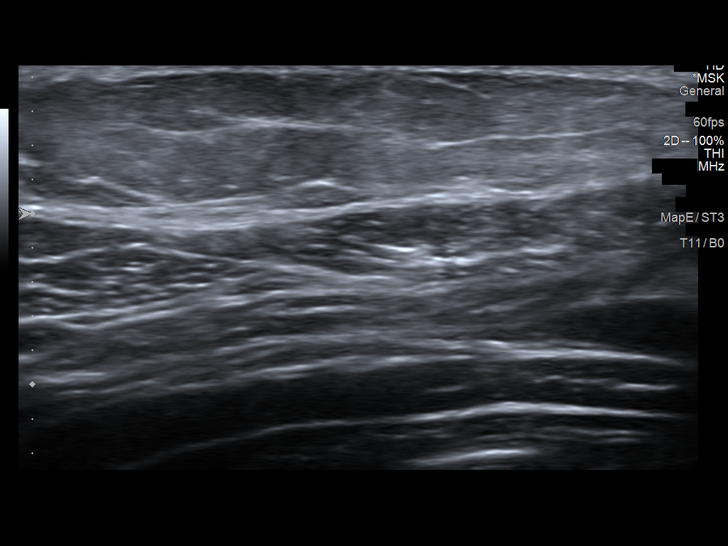
[im 8/11]
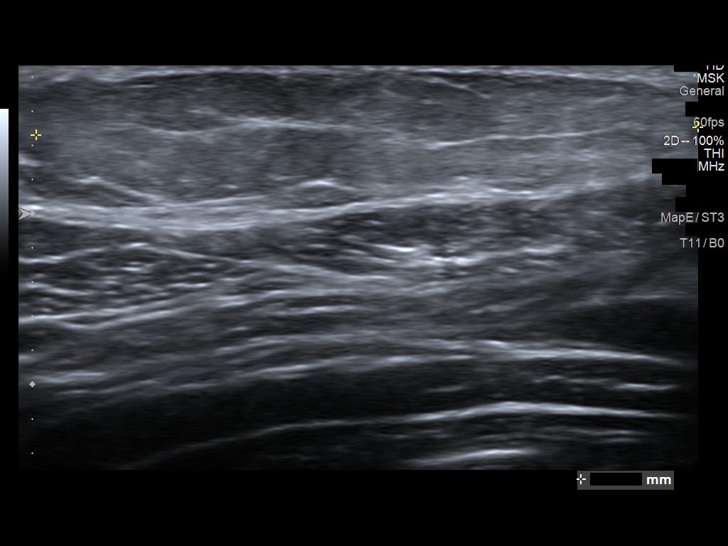
[im 9/11]
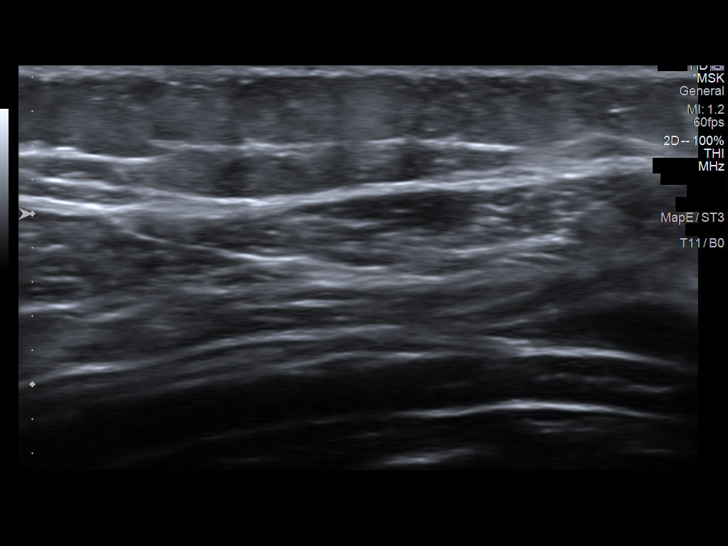
[im 10/11]
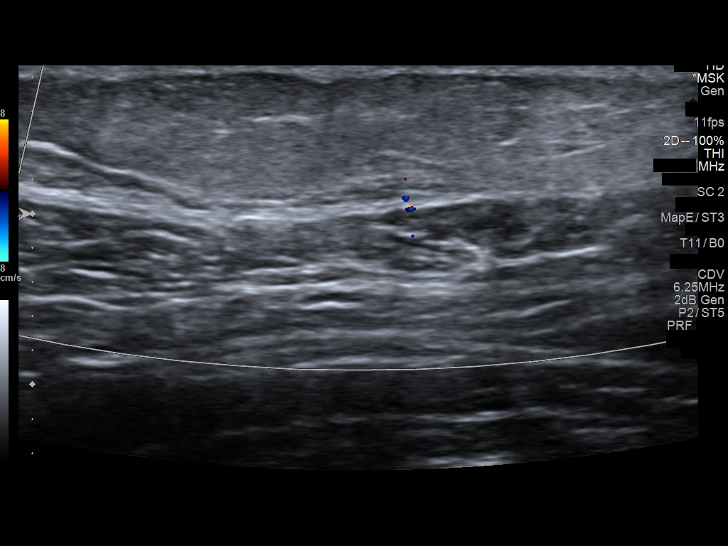
[im 11/11]
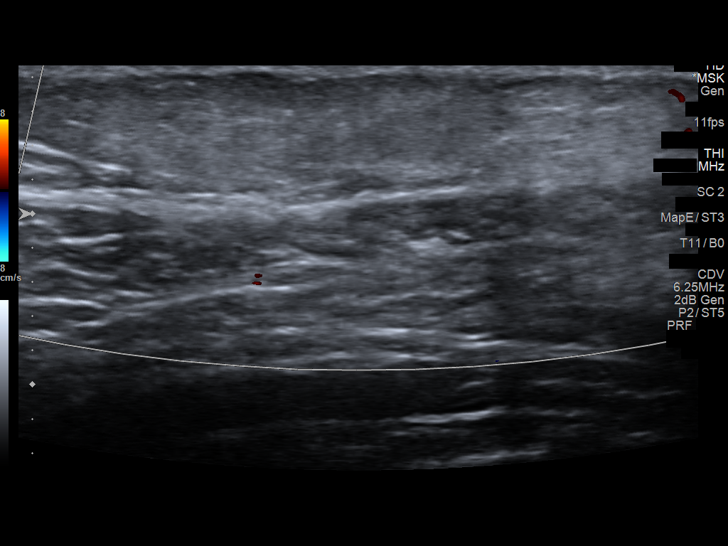

[11 of 11 positions shown; findings below may reference images not displayed]

FINDINGS: Targeted ultrasound of the region of concern is performed. This is
described as the left anterolateral chest. Corresponding to the
palpable mass is a solid slightly echogenic mass measuring 1.5 x
x 3.9 cm without significant internal flow.
IMPRESSION: 3.9 cm echogenic solid mass corresponding to the left chest wall
palpable mass. Imaging features are nonspecific but are suggestive
of a fatty based lesion such as lipoma. Limited CT of the region of
concern could be performed if desired.

## 2022-08-06 ENCOUNTER — Emergency Department (HOSPITAL_COMMUNITY): Payer: 59

## 2022-08-06 ENCOUNTER — Emergency Department (HOSPITAL_COMMUNITY)
Admission: EM | Admit: 2022-08-06 | Discharge: 2022-08-06 | Disposition: A | Discharge status: Home / Self Care | Payer: 59 | Attending: Emergency Medicine | Admitting: Emergency Medicine

## 2022-08-06 ENCOUNTER — Other Ambulatory Visit: Payer: Self-pay

## 2022-08-06 DIAGNOSIS — I10 Essential (primary) hypertension: Secondary | ICD-10-CM | POA: Diagnosis not present

## 2022-08-06 DIAGNOSIS — Z743 Need for continuous supervision: Secondary | ICD-10-CM | POA: Diagnosis not present

## 2022-08-06 DIAGNOSIS — R0989 Other specified symptoms and signs involving the circulatory and respiratory systems: Secondary | ICD-10-CM | POA: Diagnosis not present

## 2022-08-06 DIAGNOSIS — X58XXXA Exposure to other specified factors, initial encounter: Secondary | ICD-10-CM | POA: Diagnosis not present

## 2022-08-06 DIAGNOSIS — T18128A Food in esophagus causing other injury, initial encounter: Secondary | ICD-10-CM | POA: Diagnosis not present

## 2022-08-06 DIAGNOSIS — R457 State of emotional shock and stress, unspecified: Secondary | ICD-10-CM | POA: Diagnosis not present

## 2022-08-06 SURGERY — EGD (ESOPHAGOGASTRODUODENOSCOPY)
Anesthesia: Monitor Anesthesia Care

## 2022-08-06 MED ORDER — ONDANSETRON HCL 4 MG/2ML IJ SOLN
4.0000 mg | Freq: Once | INTRAMUSCULAR | Status: AC
Start: 2022-08-06 — End: 2022-08-06
  Administered 2022-08-06: 4 mg via INTRAVENOUS
  Filled 2022-08-06: qty 2

## 2022-08-06 MED ORDER — GLUCAGON HCL RDNA (DIAGNOSTIC) 1 MG IJ SOLR
1.0000 mg | Freq: Once | INTRAMUSCULAR | Status: AC
  Administered 2022-08-06: 1 mg via INTRAVENOUS
  Filled 2022-08-06: qty 1

## 2022-08-06 NOTE — ED Notes (Signed)
Pt is able to tolerate drinking soda

## 2022-08-06 NOTE — ED Provider Notes (Signed)
[facility] Provider Note   CSN: [id number] Arrival date & time: 08/06/22  1509     History  Chief Complaint  Patient presents with   Dysphagia    Joseph Dorsey is a 42 y.o. male.  HPI Patient presents for choking episode.  Medical history includes prior ex lap surgery, incisional hernia s/p repair.  Earlier today, at around noon, he was at a church having lunch.  Lunch consisted of mashed potatoes and chicken.  While he was eating, he had a choking episode.  He felt a globus sensation in the area of his GEJ.  He went to the bathroom and gag himself to throw up.  He initially felt better but subsequently had further episodes of spitting up saliva.  With these episodes, he would have tingling in his bilateral hands.  He would have some shortness of breath.  Ultimately, EMS was called.  Patient was placed on supplemental oxygen for comfort.  He states that his globus sensation has improved.  He denies any current nausea.  He reports that he has had similar episodes in the past but is never seen a gastroenterologist for this.  He has never undergone EGD procedure.    Home Medications Prior to Admission medications   Medication Sig Start Date End Date Taking? Authorizing Provider  Multiple Vitamin (MULTIVITAMIN WITH MINERALS) TABS tablet Take 1 tablet by mouth daily.   Yes [provider]  HYDROcodone-acetaminophen (NORCO) 5-325 MG tablet Take 1 tablet by mouth every 4 (four) hours as needed for moderate pain. Patient not taking: Reported on 08/06/2022 05/27/21   Aviva Signs, MD      Allergies    Patient has no known allergies.    Review of Systems   Review of Systems  HENT:  Positive for trouble swallowing.   Respiratory:  Positive for shortness of breath.   Gastrointestinal:  Positive for vomiting.  All other systems reviewed and are negative.   Physical Exam Updated Vital Signs BP 130/86   Pulse 77   Temp 98.2 F (36.8  C) (Oral)   Resp 18   SpO2 97%  Physical Exam Vitals and nursing note reviewed.  Constitutional:      General: He is not in acute distress.    Appearance: Normal appearance. He is well-developed. He is not ill-appearing, toxic-appearing or diaphoretic.  HENT:     Head: Normocephalic and atraumatic.     Right Ear: External ear normal.     Left Ear: External ear normal.     Nose: Nose normal.     Mouth/Throat:     Mouth: Mucous membranes are moist.     Pharynx: Oropharynx is clear.  Eyes:     Extraocular Movements: Extraocular movements intact.     Conjunctiva/sclera: Conjunctivae normal.  Cardiovascular:     Rate and Rhythm: Normal rate and regular rhythm.     Heart sounds: No murmur heard. Pulmonary:     Effort: Pulmonary effort is normal. No respiratory distress.     Breath sounds: Normal breath sounds. No stridor. No wheezing, rhonchi or rales.  Abdominal:     General: There is no distension.     Palpations: Abdomen is soft.     Tenderness: There is no abdominal tenderness.  Musculoskeletal:        General: No swelling. Normal range of motion.     Cervical back: Normal range of motion and neck supple.  Skin:    General: Skin is  warm and dry.     Coloration: Skin is not jaundiced or pale.  Neurological:     General: No focal deficit present.     Mental Status: He is alert and oriented to person, place, and time.     Cranial Nerves: No cranial nerve deficit.     Sensory: No sensory deficit.     Motor: No weakness.     Coordination: Coordination normal.  Psychiatric:        Mood and Affect: Mood normal.        Behavior: Behavior normal.        Thought Content: Thought content normal.        Judgment: Judgment normal.     ED Results / Procedures / Treatments   Labs (all labs ordered are listed, but only abnormal results are displayed) Labs Reviewed - No data to display  EKG None  Radiology DG Chest Portable 1 View  Result Date: 08/06/2022 CLINICAL DATA:   Choking episode EXAM: PORTABLE CHEST 1 VIEW COMPARISON:  None Available. FINDINGS: The heart size and mediastinal contours are within normal limits. Both lungs are clear. The visualized skeletal structures are unremarkable. IMPRESSION: No active disease. Electronically Signed   By: Dorise Bullion III M.D.   On: 08/06/2022 16:04    Procedures Procedures    Medications Ordered in ED Medications  glucagon (human recombinant) (GLUCAGEN) injection 1 mg (1 mg Intravenous Given 08/06/22 1558)  ondansetron (ZOFRAN) injection 4 mg (4 mg Intravenous Given 08/06/22 1558)    ED Course/ Medical Decision Making/ A&P                             Medical Decision Making Amount and/or Complexity of Data Reviewed Radiology: ordered.  Risk Prescription drug management.   This patient presents to the ED for concern of difficulty swallowing, this involves an extensive number of treatment options, and is a complaint that carries with it a high risk of complications and morbidity.  The differential diagnosis includes food bolus, Schatzki's ring, esophageal stricture, neoplasm, GERD, gastritis   Co morbidities that complicate the patient evaluation  N/A   Additional history obtained:  Additional history obtained from N/A External records from outside source obtained and reviewed including EMR   Imaging Studies ordered:  I ordered imaging studies including chest x-ray I independently visualized and interpreted imaging which showed no acute findings I agree with the radiologist interpretation   Cardiac Monitoring: / EKG:  The patient was maintained on a cardiac monitor.  I personally viewed and interpreted the cardiac monitored which showed an underlying rhythm of: Sinus rhythm   Consultations Obtained:  I requested consultation with the gastroenterologist, Dr. Collene Mares,  and discussed lab and imaging findings as well as pertinent plan - they recommend: Initial plan for EGD.  Following passage of  food impaction, they will plan on close outpatient follow-up.   Problem List / ED Course / Critical interventions / Medication management  Patient presents for choking episode while eating chicken potatoes.  He since had multiple episodes of gagging and vomiting.  He previously had a globus sensation that he describes in the area of his GEJ.  This sensation has improved.  On arrival in the ED, he is well-appearing.  Vital signs are normal.  He has no increased work of breathing.  His lungs are clear to auscultation.  History consistent with food bolus.  Given his improved symptoms, there is high likelihood that  food bolus has passed.  Patient was given Zofran, glucagon, and Coca-Cola.  Given his episodes of shortness of breath, chest x-ray was ordered.  X-ray showed no acute findings.  Patient's breathing remained normal.  Following Zofran and glucagon, he did have recurrence of globus sensation and sensation of saliva pooling in his esophagus.  With the sensations, he will have worsening nausea.  Gastroenterology was consulted.  I spoke with gastroenterologist, Dr. Collene Mares, who will plan on EGD.  While in the ED, patient developed hiccups.  While hiccuping, he felt the passage of his food impaction.  He was subsequently able to eat and drink without difficulty.  Dr. Collene Mares did come and evaluate the patient in the ED.  She will plan on close outpatient follow-up.  GI team will reach out to him tomorrow to schedule appointments.  Patient was discharged in good condition. I ordered medication including Zofran and glucagon for nausea and food bolus Reevaluation of the patient after these medicines showed that the patient resolved I have reviewed the patients home medicines and have made adjustments as needed   Social Determinants of Health:  Has PCP        Final Clinical Impression(s) / ED Diagnoses Final diagnoses:  Food impaction of esophagus, initial encounter    Rx / DC Orders ED Discharge  Orders     None         Godfrey Pick, MD 08/06/22 [facility] Emer[facility]

## 2022-08-06 NOTE — ED Notes (Addendum)
Pt vomited a small amount, also had hiccups for several minutes- states he feels like something "moved" and he is breathing better, but can't tell if something is still stuck in his throat

## 2022-08-06 NOTE — ED Notes (Signed)
GI MD at bedside

## 2022-08-06 NOTE — ED Triage Notes (Signed)
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 Pt states he ate chicken and mashed potatoes and thinks he ate too fast, now has feeling of food stuck in his throat. Pt states he has had this problem of choking in the past. 176/100 BP 96 HR 24 RR

## 2022-08-06 NOTE — Discharge Instructions (Addendum)
Follow-up with the gastroenterologist in the office.  If you do not hear from them tomorrow, call the number below.  Return to the emergency department for any further symptoms of concern.

## 2022-08-22 DIAGNOSIS — K219 Gastro-esophageal reflux disease without esophagitis: Secondary | ICD-10-CM | POA: Diagnosis not present

## 2022-08-22 DIAGNOSIS — R1310 Dysphagia, unspecified: Secondary | ICD-10-CM | POA: Diagnosis not present

## 2022-08-28 DIAGNOSIS — K200 Eosinophilic esophagitis: Secondary | ICD-10-CM | POA: Diagnosis not present

## 2022-08-28 DIAGNOSIS — R1310 Dysphagia, unspecified: Secondary | ICD-10-CM | POA: Diagnosis not present

## 2022-08-28 DIAGNOSIS — R945 Abnormal results of liver function studies: Secondary | ICD-10-CM | POA: Diagnosis not present

## 2022-08-28 DIAGNOSIS — K222 Esophageal obstruction: Secondary | ICD-10-CM | POA: Diagnosis not present

## 2022-08-28 DIAGNOSIS — K2289 Other specified disease of esophagus: Secondary | ICD-10-CM | POA: Diagnosis not present

## 2022-08-28 DIAGNOSIS — K219 Gastro-esophageal reflux disease without esophagitis: Secondary | ICD-10-CM | POA: Diagnosis not present

## 2022-09-17 IMAGING — CT CT ABD-PELV W/O CM
2 of 4 series · 16 of 46 positions shown, 18 images · non-contrast
Comparison: None.

CLINICAL DATA: Abdominal pain, hernia suspected

EXAM:
CT ABDOMEN AND PELVIS WITHOUT CONTRAST
TECHNIQUE: Multidetector CT imaging of the abdomen and pelvis was performed
following the standard protocol without IV contrast.

[Series 2: abd pel wo · axial · 0.71mm/px · z∈[-516,-116]mm · 13 of 91 slices shown, 15 images]
[im 7/91  soft-tissue]
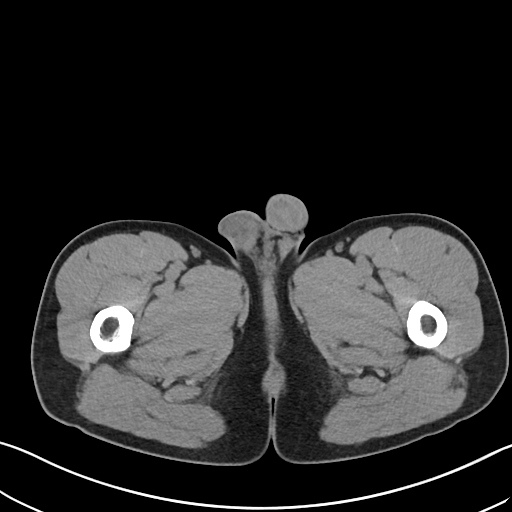
[im 7/91  bone]
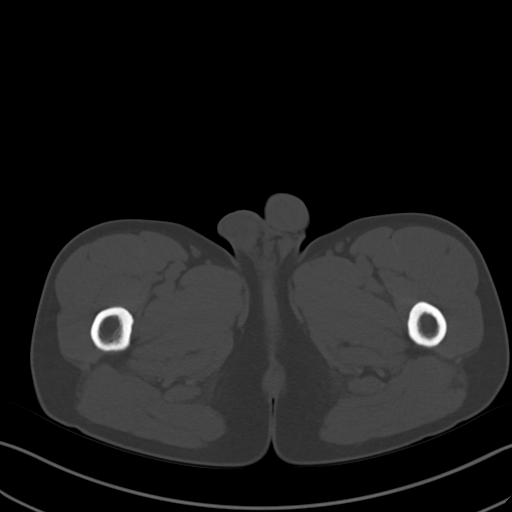
[im 14/91  soft-tissue]
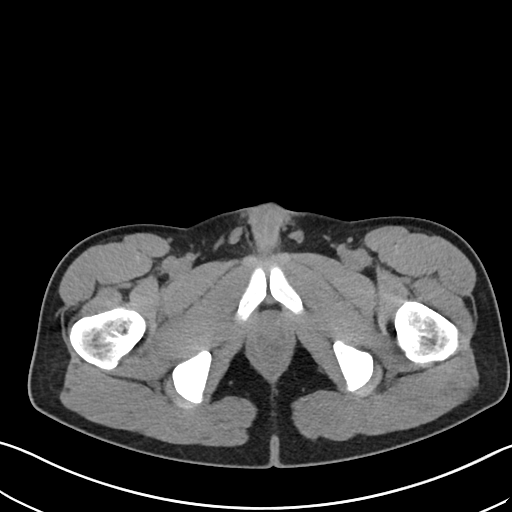
[im 21/91  soft-tissue]
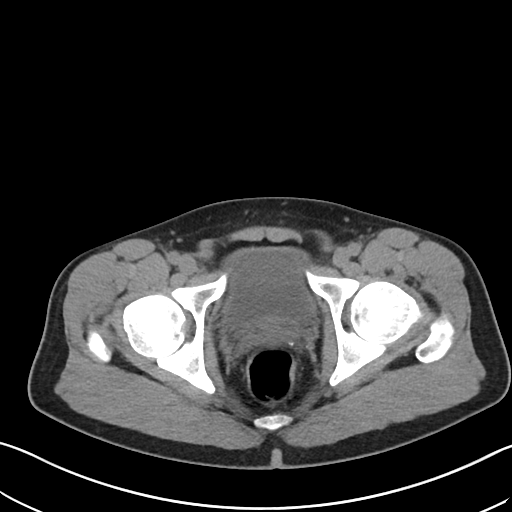
[im 27/91  soft-tissue]
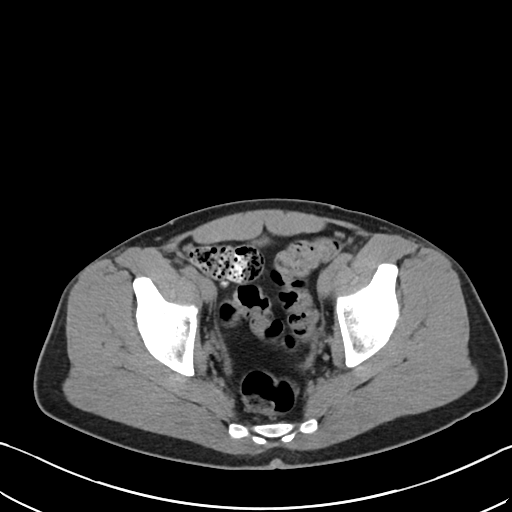
[im 34/91  soft-tissue]
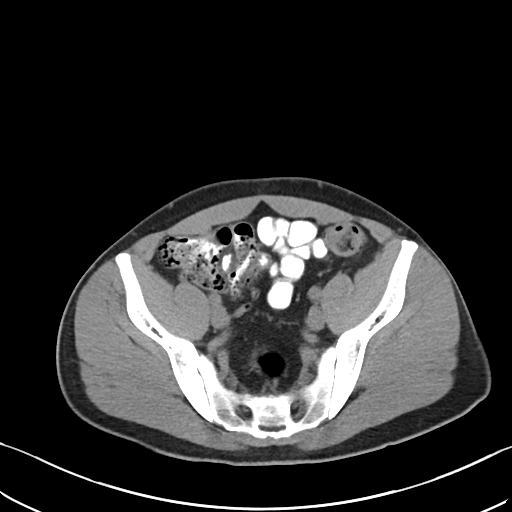
[im 41/91  soft-tissue]
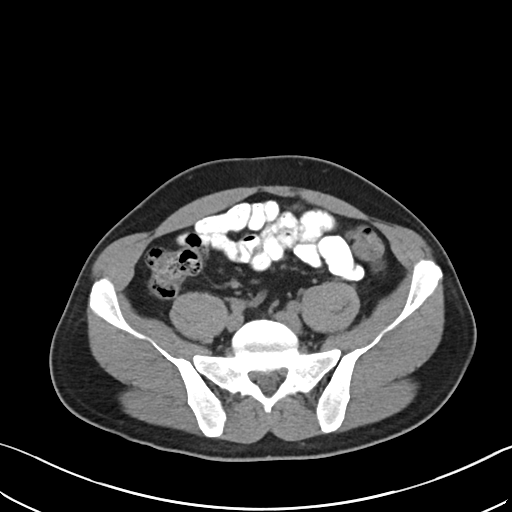
[im 47/91  soft-tissue]
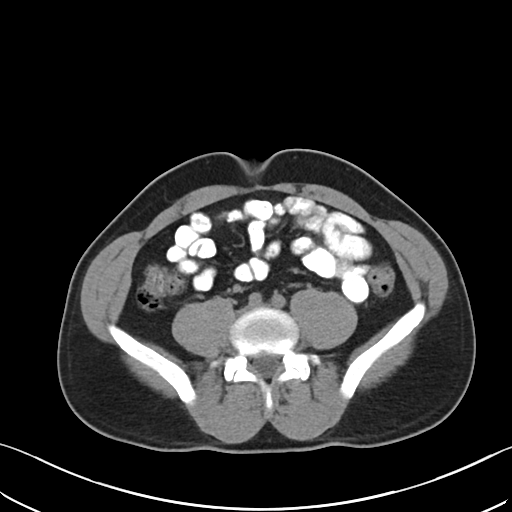
[im 54/91  soft-tissue]
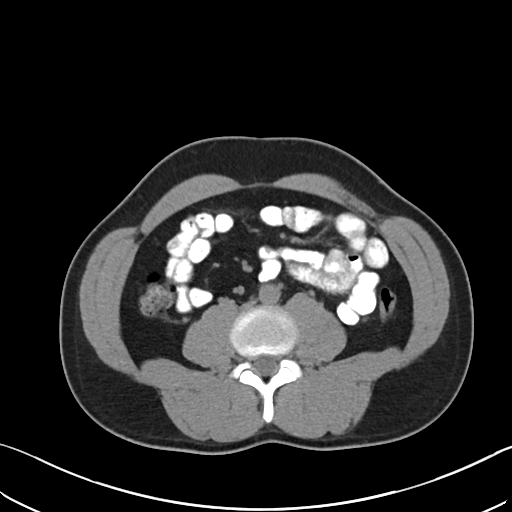
[im 61/91  soft-tissue]
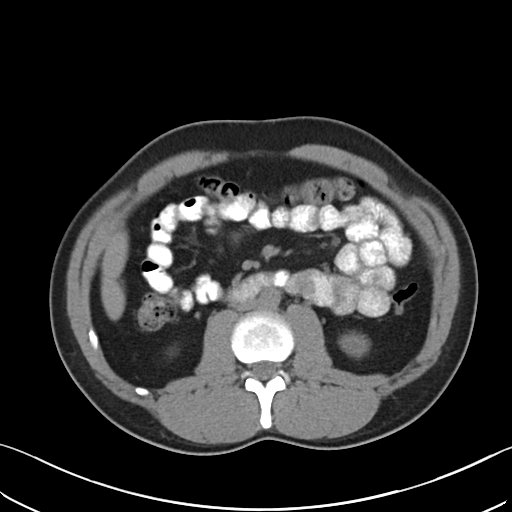
[im 61/91  bone]
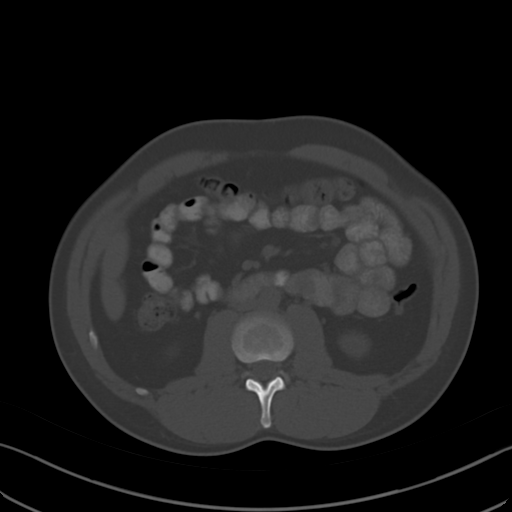
[im 67/91  soft-tissue]
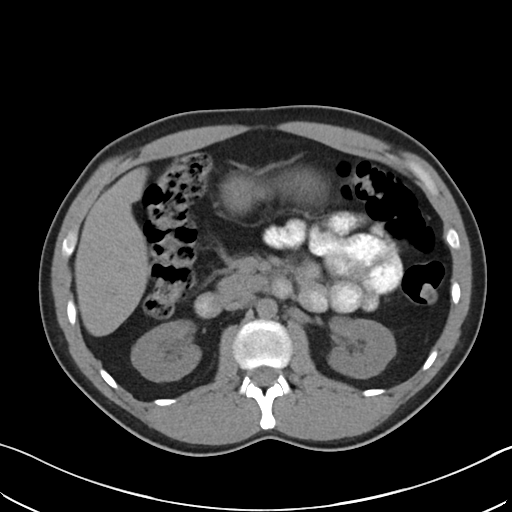
[im 74/91  soft-tissue]
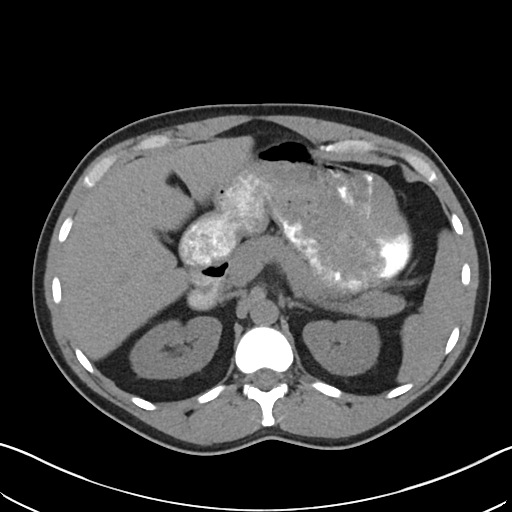
[im 81/91  soft-tissue]
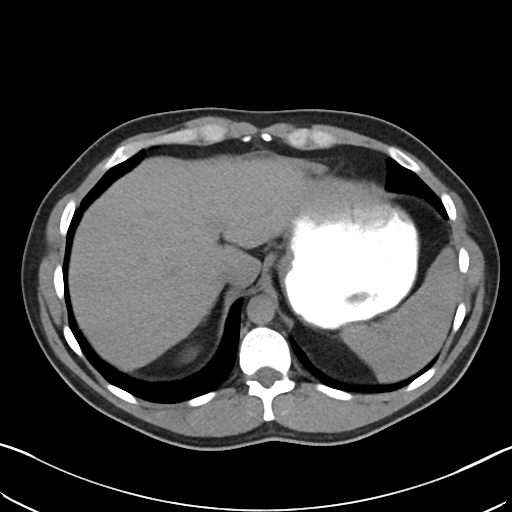
[im 87/91  soft-tissue]
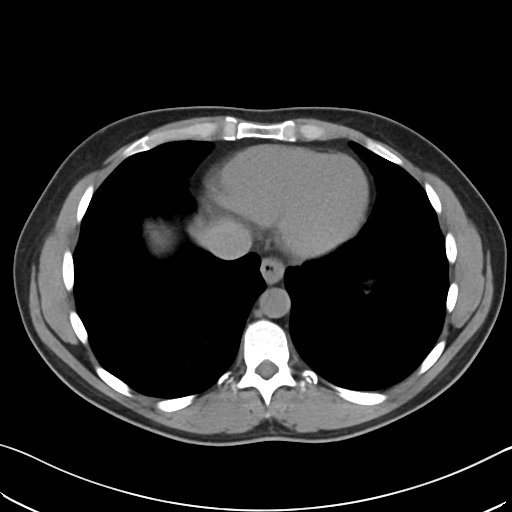

[Series 5: coronal · coronal · 0.70mm/px · 3 of 89 slices shown]
[im 30/89  soft-tissue]
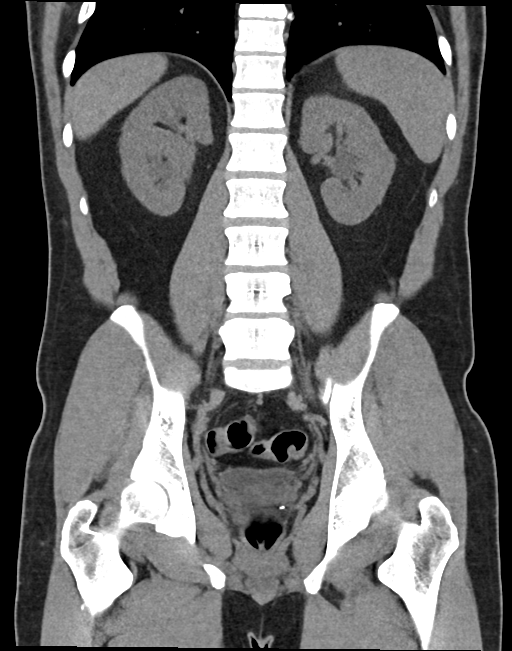
[im 40/89  soft-tissue]
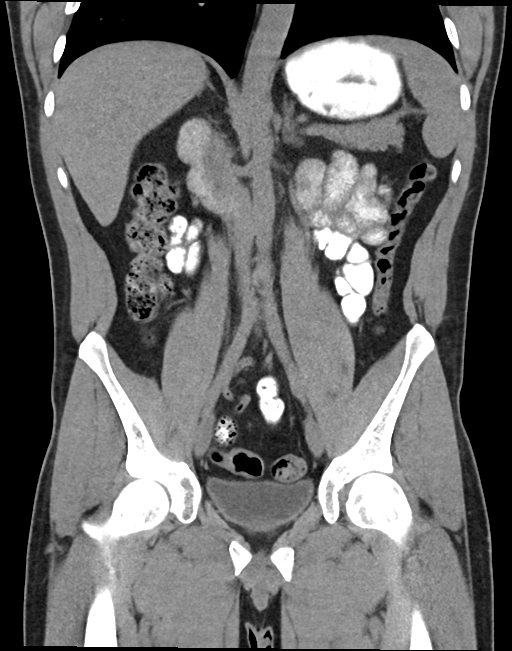
[im 49/89  soft-tissue]
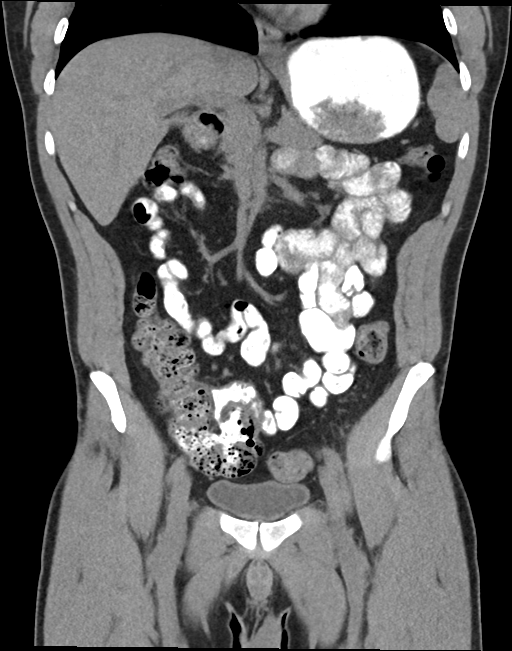

[16 of 46 positions shown; findings below may reference images not displayed]

FINDINGS: Lower chest: Lung bases are clear. No effusions. Heart is normal
size.

Hepatobiliary: No focal hepatic abnormality. Gallbladder
unremarkable.

Pancreas: No focal abnormality or ductal dilatation.

Spleen: No focal abnormality.  Normal size.

Adrenals/Urinary Tract: No adrenal abnormality. No focal renal
abnormality. No stones or hydronephrosis. Urinary bladder is
unremarkable.

Stomach/Bowel: Normal appendix. Stomach, large and small bowel
grossly unremarkable.

Vascular/Lymphatic: No evidence of aneurysm or adenopathy.

Reproductive: No visible focal abnormality.

Other: No free fluid or free air. Small periumbilical hernia
containing fat, just superior to the umbilicus.

Musculoskeletal: No acute bony abnormality.
IMPRESSION: No acute findings in the abdomen or pelvis.

Small periumbilical ventral hernia containing fat.
